# Patient Record
Sex: Male | Born: 1985 | Race: White | Hispanic: No | Marital: Single | Smoking: Never smoker
Health system: Southern US, Community
[De-identification: ages and names within clinical notes are randomized; demographics above are authoritative.]

## PROBLEM LIST (undated history)

## (undated) DIAGNOSIS — F419 Anxiety disorder, unspecified: Secondary | ICD-10-CM

## (undated) DIAGNOSIS — K219 Gastro-esophageal reflux disease without esophagitis: Secondary | ICD-10-CM

## (undated) DIAGNOSIS — F329 Major depressive disorder, single episode, unspecified: Secondary | ICD-10-CM

## (undated) DIAGNOSIS — F32A Depression, unspecified: Secondary | ICD-10-CM

## (undated) HISTORY — DX: Depression, unspecified: F32.A

## (undated) HISTORY — DX: Major depressive disorder, single episode, unspecified: F32.9

## (undated) HISTORY — PX: OTHER SURGICAL HISTORY: SHX169

## (undated) HISTORY — DX: Anxiety disorder, unspecified: F41.9

## (undated) HISTORY — DX: Gastro-esophageal reflux disease without esophagitis: K21.9

---

## 2017-11-27 ENCOUNTER — Ambulatory Visit (HOSPITAL_COMMUNITY): Payer: BLUE CROSS/BLUE SHIELD | Admitting: Psychiatry

## 2017-11-27 ENCOUNTER — Encounter (INDEPENDENT_AMBULATORY_CARE_PROVIDER_SITE_OTHER): Payer: Self-pay

## 2017-11-27 ENCOUNTER — Encounter (HOSPITAL_COMMUNITY): Payer: Self-pay | Admitting: Psychiatry

## 2017-11-27 VITALS — BP 122/70 | HR 74 | Ht 71.5 in | Wt 223.6 lb

## 2017-11-27 DIAGNOSIS — R4581 Low self-esteem: Secondary | ICD-10-CM

## 2017-11-27 DIAGNOSIS — F840 Autistic disorder: Secondary | ICD-10-CM | POA: Diagnosis not present

## 2017-11-27 DIAGNOSIS — F411 Generalized anxiety disorder: Secondary | ICD-10-CM | POA: Diagnosis not present

## 2017-11-27 DIAGNOSIS — Z818 Family history of other mental and behavioral disorders: Secondary | ICD-10-CM

## 2017-11-27 DIAGNOSIS — Z79899 Other long term (current) drug therapy: Secondary | ICD-10-CM

## 2017-11-27 DIAGNOSIS — F331 Major depressive disorder, recurrent, moderate: Secondary | ICD-10-CM | POA: Diagnosis not present

## 2017-11-27 MED ORDER — VORTIOXETINE HBR 5 MG PO TABS: 5.0000 mg | ORAL_TABLET | ORAL | 3 refills | Status: DC

## 2017-11-27 NOTE — Progress Notes (Signed)
Psychiatric Initial Adult Assessment   Patient Identification: Donald Barr MRN:  [id number] Date of Evaluation:  11/28/2017 Referral Source: self  Chief Complaint:  anxiety Visit Diagnosis:    ICD-10-CM   1. Autism spectrum F84.0 Neuropsychological testing    vortioxetine HBr (TRINTELLIX) 5 MG TABS    Ambulatory referral to Psychology  2. Generalized anxiety disorder F41.1 vortioxetine HBr (TRINTELLIX) 5 MG TABS    Ambulatory referral to Psychology  3. Moderate episode of recurrent major depressive disorder (HCC) F33.1 vortioxetine HBr (TRINTELLIX) 5 MG TABS    Ambulatory referral to Psychology    History of Present Illness:  Donald Barr is a 31 year old male with a psychiatric history of anxiety, social anxiety, OCD, suspected autism spectrum disorder since childhood.  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  He lives with a roommate with whom he gets along well.  He identifies as gay, is not currently in any sexual relationship and does not have any partner.  He reports that he was previously on Prozac when he was a teenager which helped with his anxiety and obsessive tendencies.  He wonders about restarting an SSRI to help with his social anxiety, and excessive worry.  He reports that he tends to sleep well at night.  He denies any suicidal thoughts or self-harm thoughts.  He does not engage in any substance abuse.  His anxiety and mood symptoms include excessive worry, periods of dysphoria and depression, decreased energy, difficulty with concentration.  He felt that Prozac was helpful but it made him feel a bit flat in terms of his affect.  He also dislikes some of the sexual side effects.  He does not have any history of mania or psychosis.  He has a family history of anxiety.  We agreed to start Trintellix 5 mg daily and increase to 10 mg daily as tolerated.  He describes that he is fairly  sensitive to medicines, I reviewed the risks of gastrointestinal distress and nausea, and encouraged him to reach out if he has any side effects.  With regard to autism, he has not been formally assessed or diagnosed, but he does present with the social impairments, repetitive and obsessive behaviors, and does endorse difficulties with speech and language as a child.  He is now as an adult quite obsessed with language.  He was able to guess the origin of this writer's last name as being ChadPersian.  He speaks 3 different languages, and is quite fascinated with various dialect and cultures.  He would like to work with a therapist on being able to have more comfort and socialization and eye contact.  He reports that eye contact has always been uncomfortable for him, so he has trained himself to look at people's foreheads..  Past Psychiatric History: none  Previous Psychotropic Medications: Yes   Substance Abuse History in the last 12 months:  No.  Consequences of Substance Abuse: Negative  Past Medical History:  Past Medical History:  Diagnosis Date  . Anxiety   . Depression   . GERD (gastroesophageal reflux disease)     Past Surgical History:  Procedure Laterality Date  . right ankle surgery      Family Psychiatric History: History of anxiety  Family History: History reviewed. No pertinent family history.  Social History:   Social History   Socioeconomic History  . Marital status: Single    Spouse name: None  . Number  of children: None  . Years of education: None  . Highest education level: None  Social Needs  . Financial resource strain: Somewhat hard  . Food insecurity - worry: Never true  . Food insecurity - inability: Never true  . Transportation needs - medical: Yes  . Transportation needs - non-medical: Yes  Occupational History  . None  Tobacco Use  . Smoking status: Never Smoker  . Smokeless tobacco: Never Used  Substance and Sexual Activity  . Alcohol use: No     Frequency: Never  . Drug use: No  . Sexual activity: No  Other Topics Concern  . None  Social History Narrative  . None    Additional Social History: Lives with a roommate in [place], has a masters degree in [place] literature  Allergies:   Allergies  Allergen Reactions  . Clonazepam     Patient had suicidal thoughts     Metabolic Disorder Labs: No results found for: HGBA1C, MPG No results found for: PROLACTIN No results found for: CHOL, TRIG, HDL, CHOLHDL, VLDL, LDLCALC   Current Medications: Current Outpatient Medications  Medication Sig Dispense Refill  . vortioxetine HBr (TRINTELLIX) 5 MG TABS Take 1 tablet (5 mg total) by mouth every morning. 30 tablet 3   No current facility-administered medications for this visit.     Neurologic: Headache: Negative Seizure: Negative Paresthesias:Negative  Musculoskeletal: Strength & Muscle Tone: within normal limits Gait & Station: normal Patient leans: N/A  Psychiatric Specialty Exam: Review of Systems  Constitutional: Negative.   HENT: Negative.   Eyes: Negative.   Respiratory: Negative.   Cardiovascular: Negative.   Gastrointestinal: Negative.   Musculoskeletal: Negative.   Neurological: Negative.   Psychiatric/Behavioral: Positive for depression. The patient is nervous/anxious.     Blood pressure 122/70, pulse 74, height 5' 11.5" (1.816 m), weight 223 lb 9.6 oz (101.4 kg).Body mass index is 30.75 kg/m.  General Appearance: Casual and Well Groomed  Eye Contact:  Poor  Speech:  Clear and Coherent  Volume:  loud, robotic, monotone  Mood:  Euthymic  Affect:  limited range  Thought Process:  Goal Directed and Descriptions of Associations: Intact  Orientation:  Full (Time, Place, and Person)  Thought Content:  Computation  Suicidal Thoughts:  No  Homicidal Thoughts:  No  Memory:  Immediate;   Fair  Judgement:  Good  Insight:  Fair  Psychomotor Activity:  Normal  Concentration:  Attention Span: Fair   Recall:  [employer] of Knowledge:Good  Language: Good  Akathisia:  Negative  Handed:  Right  AIMS (if indicated):  n/a  Assets:  Communication Skills Desire for Improvement Financial Resources/Insurance Housing Transportation Vocational/Educational  ADL's:  Intact  Cognition: WNL  Sleep:  6-8 hours    Treatment Plan Summary: Donald Barr is a 31 year old male with a presumed diagnosis of autism spectrum disorder, high functioning, in conjunction with generalized anxiety, social anxiety, and features of obsessive-compulsive disorder.  He presents with the characteristic history of developmental delay, ongoing deficits in interpersonal socialization, repetitive and obsessive behaviors.  He is predominantly distressed by his periods of social anxiety, which contribute to dysphoria and poor self-esteem.  He very much desires improving his ability to socialize and connect with others.  I believe he would benefit from low-dose SSRI and social skills training and he is agreeable to both interventions.  He had previously been on Prozac as a older teenager and reports benefits except for some sexual side effects and feeling that his affect was  even more flattened, but his social anxiety was better.  We agreed to start vortioxetine given the reduced side effect profile and improvements in cognitive processing.  1. Autism spectrum   2. Generalized anxiety disorder   3. Moderate episode of recurrent major depressive disorder (HCC)     Status of current problems: new  Labs Ordered: Orders Placed This Encounter  Procedures  . Ambulatory referral to Psychology    Referral Priority:   Routine    Referral Type:   Psychiatric    Referral Reason:   Specialty Services Required    Requested Specialty:   Psychology    Number of Visits Requested:   1  . Neuropsychological testing    Standing Status:   Future    Standing Expiration Date:   11/27/2018    Scheduling Instructions:     ADHD and  autism testing, please complete formal IQ testing as well.    Order Specific Question:   Where should this test [facility]    [facility]    Labs Reviewed: n/a  Collateral Obtained/Records Reviewed: [facility] - prescribed clonazepam by Obie DredgeBrittany Davis, family medicine PA, in March 2017   Plan:  Trintellix 5 mg daily RTC 3 months Individual therapy referral  I spent 40 minutes with the patient in direct face-to-face clinical care.  Greater than 50% of this time was spent in counseling and coordination of care with the patient.   Burnard LeighAlexander Arya Casilda Pickerill, MD 12/5/20189:03 AM

## 2018-02-14 ENCOUNTER — Ambulatory Visit (HOSPITAL_COMMUNITY): Payer: BLUE CROSS/BLUE SHIELD | Admitting: Psychiatry

## 2018-12-19 ENCOUNTER — Emergency Department (HOSPITAL_COMMUNITY): Payer: BLUE CROSS/BLUE SHIELD

## 2018-12-19 ENCOUNTER — Encounter (HOSPITAL_COMMUNITY): Payer: Self-pay | Admitting: Emergency Medicine

## 2018-12-19 ENCOUNTER — Other Ambulatory Visit: Payer: Self-pay

## 2018-12-19 ENCOUNTER — Emergency Department (HOSPITAL_COMMUNITY)
Admission: EM | Admit: 2018-12-19 | Discharge: 2018-12-19 | Disposition: A | Discharge status: Home / Self Care | Payer: BLUE CROSS/BLUE SHIELD | Attending: Emergency Medicine | Admitting: Emergency Medicine

## 2018-12-19 DIAGNOSIS — R1011 Right upper quadrant pain: Secondary | ICD-10-CM

## 2018-12-19 LAB — COMPREHENSIVE METABOLIC PANEL
ALBUMIN: 4 g/dL (ref 3.5–5.0)
ALT: 22 U/L (ref 0–44)
ANION GAP: 10 (ref 5–15)
AST: 18 U/L (ref 15–41)
Alkaline Phosphatase: 84 U/L (ref 38–126)
BUN: 18 mg/dL (ref 6–20)
CHLORIDE: 104 mmol/L (ref 98–111)
CO2: 28 mmol/L (ref 22–32)
Calcium: 9.1 mg/dL (ref 8.9–10.3)
Creatinine, Ser: 0.92 mg/dL (ref 0.61–1.24)
GFR calc Af Amer: >60 mL/min (ref >60)
GFR calc non Af Amer: >60 mL/min (ref >60)
GLUCOSE: 110 mg/dL — AB (ref 70–99)
POTASSIUM: 4.4 mmol/L (ref 3.5–5.1)
Sodium: 142 mmol/L (ref 135–145)
TOTAL PROTEIN: 7.3 g/dL (ref 6.5–8.1)
Total Bilirubin: 0.6 mg/dL (ref 0.3–1.2)

## 2018-12-19 LAB — LIPASE, BLOOD: Lipase: 28 U/L (ref 11–51)

## 2018-12-19 LAB — CBC WITH DIFFERENTIAL/PLATELET
ABS IMMATURE GRANULOCYTES: 0.03 10*3/uL (ref 0.00–0.07)
BASOS ABS: 0 10*3/uL (ref 0.0–0.1)
Basophils Relative: 1 %
EOS ABS: 0 10*3/uL (ref 0.0–0.5)
Eosinophils Relative: 1 %
HEMATOCRIT: 46.9 % (ref 39.0–52.0)
Hemoglobin: 15.1 g/dL (ref 13.0–17.0)
Immature Granulocytes: 0 %
LYMPHS ABS: 1.8 10*3/uL (ref 0.7–4.0)
LYMPHS PCT: 22 %
MCH: 29.9 pg (ref 26.0–34.0)
MCHC: 32.2 g/dL (ref 30.0–36.0)
MCV: 92.9 fL (ref 80.0–100.0)
MONOS PCT: 9 %
Monocytes Absolute: 0.7 10*3/uL (ref 0.1–1.0)
NEUTROS ABS: 5.4 10*3/uL (ref 1.7–7.7)
NEUTROS PCT: 67 %
NRBC: 0 % (ref 0.0–0.2)
PLATELETS: 237 10*3/uL (ref 150–400)
RBC: 5.05 MIL/uL (ref 4.22–5.81)
RDW: 12.5 % (ref 11.5–15.5)
WBC: 7.9 10*3/uL (ref 4.0–10.5)

## 2018-12-19 LAB — URINALYSIS, ROUTINE W REFLEX MICROSCOPIC
BACTERIA UA: NONE SEEN
BILIRUBIN URINE: NEGATIVE
Glucose, UA: NEGATIVE mg/dL
Ketones, ur: NEGATIVE mg/dL
Leukocytes, UA: NEGATIVE
Nitrite: NEGATIVE
Protein, ur: NEGATIVE mg/dL
SPECIFIC GRAVITY, URINE: 1.025 (ref 1.005–1.030)
pH: 5 (ref 5.0–8.0)

## 2018-12-19 MED ORDER — ONDANSETRON 8 MG PO TBDP: 8.0000 mg | ORAL_TABLET | Freq: Once | ORAL | Status: DC

## 2018-12-19 NOTE — Discharge Instructions (Addendum)
Please call the surgeon to arrange an office evaluation.  In the meantime, stay on a low-fat diet.  Return to the emergency department if you have recurrence of pain, and are not able to manage it at home.

## 2018-12-19 NOTE — ED Provider Notes (Signed)
[facility]-EMERGENCY DEPT Provider Note   CSN: [id number] Arrival date & time: 12/19/18  0146     History   Chief Complaint Chief Complaint  Patient presents with  . Abdominal Pain    RLQ    HPI Donald Barr is a 32 y.o. male.  The history is provided by the patient.  Abdominal Pain    He has history of GERD and depression and anxiety, and comes in with right upper quadrant abdominal pain which started about 1.5 hours ago.  Pain started after eating some fried rice.  There has been some mild nausea but no vomiting.  He rates his pain at 6/10.  It comes and goes.  Sometimes it is worse if he presses on the area, sometimes not.  He has never had pain like this before.  He denies fever or chills.  He has not vomited and there has been no diarrhea.  Past Medical History:  Diagnosis Date  . Anxiety   . Depression   . GERD (gastroesophageal reflux disease)     There are no active problems to display for this patient.   Past Surgical History:  Procedure Laterality Date  . right ankle surgery          Home Medications    Prior to Admission medications   Medication Sig Start Date End Date Taking? Authorizing Provider  vortioxetine HBr (TRINTELLIX) 5 MG TABS Take 1 tablet (5 mg total) by mouth every morning. 11/27/17   Eksir, Bo McclintockAlexander Arya, MD    Family History History reviewed. No pertinent family history.  Social History Social History   Tobacco Use  . Smoking status: Never Smoker  . Smokeless tobacco: Never Used  Substance Use Topics  . Alcohol use: No    Frequency: Never  . Drug use: No     Allergies   Clonazepam   Review of Systems Review of Systems  Gastrointestinal: Positive for abdominal pain.  All other systems reviewed and are negative.    Physical Exam Updated Vital Signs BP 132/77 (BP Location: Left Arm)   Pulse 72   Temp 98.4 F (36.9 C) (Oral)   Resp 18   SpO2 98%   Physical Exam Vitals signs and  nursing note reviewed.    [age] year old male, resting comfortably and in no acute distress. Vital signs are normal. Oxygen saturation is 98%, which is normal. Head is normocephalic and atraumatic. PERRLA, EOMI. Oropharynx is clear. Neck is nontender and supple without adenopathy or JVD. Back is nontender and there is no CVA tenderness. Lungs are clear without rales, wheezes, or rhonchi. Chest is nontender. Heart has regular rate and rhythm without murmur. Abdomen is soft, flat, with moderate right upper quadrant tenderness.  There is plus/minus Murphy sign.  There are no masses or hepatosplenomegaly and peristalsis is normoactive. Extremities have no cyanosis or edema, full range of motion is present. Skin is warm and dry without rash. Neurologic: Mental status is normal, cranial nerves are intact, there are no motor or sensory deficits.  ED Treatments / Results  Labs (all labs ordered are listed, but only abnormal results are displayed) Labs Reviewed  COMPREHENSIVE METABOLIC PANEL - Abnormal; Notable for the following components:      Result Value   Glucose, Bld 110 (*)    All other components within normal limits  URINALYSIS, ROUTINE W REFLEX MICROSCOPIC - Abnormal; Notable for the following components:   Hgb urine dipstick SMALL (*)    All other  components within normal limits  LIPASE, BLOOD  CBC WITH DIFFERENTIAL/PLATELET   Radiology [place] Abdomen Complete  Result Date: 12/19/2018 CLINICAL DATA:  Acute onset of right upper quadrant abdominal pain. EXAM: ABDOMEN ULTRASOUND COMPLETE COMPARISON:  None. FINDINGS: Gallbladder: There is question of a 1.1 cm stone at the fundus of the gallbladder, though this is difficult to fully characterize. Apparent gallbladder wall thickening is thought to reflect relative decompression. No ultrasonographic Murphy's sign is elicited. No pericholecystic fluid is seen. Common bile duct: Diameter: 0.2 cm, within normal limits in caliber. Liver: No focal  lesion identified. Within normal limits in parenchymal echogenicity. Portal vein is patent on color Doppler imaging with normal direction of blood flow towards the liver. IVC: No abnormality visualized. Pancreas: Visualized portion unremarkable. Spleen: Size and appearance within normal limits. Right Kidney: Length: 9.2 cm. Echogenicity within normal limits. No mass or hydronephrosis visualized. Left Kidney: Length: 12.1 cm. Echogenicity within normal limits. No mass or hydronephrosis visualized. Abdominal aorta: No aneurysm visualized. Other findings: None. IMPRESSION: 1. No acute abnormality seen in the abdomen. 2. Question of cholelithiasis. Gallbladder otherwise unremarkable, though difficult to fully assess due to the patient's habitus and adjacent bowel. Electronically Signed   By: Roanna RaiderJeffery  Chang M.D.   On: 12/19/2018 03:58    Procedures Procedures   Medications Ordered in ED Medications - No data to display   Initial Impression / Assessment and Plan / ED Course  I have reviewed the triage vital signs and the nursing notes.  Pertinent labs & imaging results that were available during my care of the patient were reviewed by me and considered in my medical decision making (see chart for details).  Right upper quadrant pain concerning for biliary colic.  Abdominal ultrasound has been ordered.  Screening labs are obtained.  Patient is requesting no medication for pain or nausea.  Old records are reviewed, and he has no relevant past visits.  Labs are reassuring.  No elevation of lipase, transaminases, alkaline phosphatase, or bilirubin.  WBC is normal.  Ultrasound shows probable 1.1cm gallbladder calculus, but incompletely characterized due to surrounding bowel gas.  Given his symptoms and physical findings, I strongly suspect that this is the cause of his pain, and [facility] is [facility].  This has been explained to the patient.  Return precautions  discussed.  Final Clinical Impressions(s) / ED Diagnoses   Final diagnoses:  RUQ pain    ED Discharge Orders    None       Dione BoozeGlick, Titianna Loomis, MD 12/19/18 [phone]

## 2018-12-19 NOTE — ED Triage Notes (Signed)
Pt reports intermitant abd pain feels like be stabb in side by sharp object.

## 2019-02-06 ENCOUNTER — Ambulatory Visit (HOSPITAL_COMMUNITY)
Admission: EM | Admit: 2019-02-06 | Discharge: 2019-02-06 | Disposition: A | Discharge status: Home / Self Care | Payer: BLUE CROSS/BLUE SHIELD | Attending: Family Medicine | Admitting: Family Medicine

## 2019-02-06 ENCOUNTER — Encounter (HOSPITAL_COMMUNITY): Payer: Self-pay | Admitting: Emergency Medicine

## 2019-02-06 DIAGNOSIS — M25512 Pain in left shoulder: Secondary | ICD-10-CM

## 2019-02-06 DIAGNOSIS — G8929 Other chronic pain: Secondary | ICD-10-CM | POA: Diagnosis not present

## 2019-02-06 DIAGNOSIS — F419 Anxiety disorder, unspecified: Secondary | ICD-10-CM | POA: Diagnosis not present

## 2019-02-06 MED ORDER — CYCLOBENZAPRINE HCL 5 MG PO TABS: 5.0000 mg | ORAL_TABLET | Freq: Every day | ORAL | 0 refills | Status: AC

## 2019-02-06 MED ORDER — RANITIDINE HCL 150 MG PO CAPS: 150.0000 mg | ORAL_CAPSULE | Freq: Every day | ORAL | 0 refills | Status: AC

## 2019-02-06 NOTE — ED Provider Notes (Signed)
[facility]    CSN: [id number] Arrival date & time: 02/06/19  1819     History   Chief Complaint Chief Complaint  Patient presents with  . Anxiety  . Epigastric Pain    HPI Donald Barr is a 33 y.o. male.   Pt states he has issues with anxiety, states hes taken previous anxiety meds (prozac) but didn't like the side affects so hes been off for a year. Pt states he also has indigestion. Pt c/o anxiety x5 days. And chest pain as well.      Past Medical History:  Diagnosis Date  . Anxiety   . Depression   . GERD (gastroesophageal reflux disease)     There are no active problems to display for this patient.   Past Surgical History:  Procedure Laterality Date  . right ankle surgery         Home Medications    Prior to Admission medications   Medication Sig Start Date End Date Taking? Authorizing Provider  cyclobenzaprine (FLEXERIL) 5 MG tablet Take 1 tablet (5 mg total) by mouth at bedtime. 02/06/19   Elvina SidleLauenstein, Denorris Reust, MD  omeprazole (PRILOSEC OTC) 20 MG tablet Take 20 mg by mouth daily.    [provider]  ranitidine (ZANTAC) 150 MG capsule Take 1 capsule (150 mg total) by mouth daily. 02/06/19   Elvina SidleLauenstein, Amed Datta, MD    Family History No family history on file.  Social History Social History   Tobacco Use  . Smoking status: Never Smoker  . Smokeless tobacco: Never Used  Substance Use Topics  . Alcohol use: No    Frequency: Never  . Drug use: No     Allergies   Clonazepam   Review of Systems Review of Systems   Physical Exam Triage Vital Signs ED Triage Vitals  Enc Vitals Group     BP 02/06/19 1850 111/68     Pulse Rate 02/06/19 1850 100     Resp 02/06/19 1850 20     Temp 02/06/19 1850 98 F (36.7 C)     Temp src --      SpO2 02/06/19 1850 100 %     Weight --      Height --      Head Circumference --      Peak Flow --      Pain Score 02/06/19 1853 0     Pain Loc --      Pain Edu? --      Excl. in GC? --      No data found.  Updated Vital Signs BP 111/68   Pulse 100   Temp 98 F (36.7 C)   Resp 20   SpO2 100%   Physical Exam Vitals signs and nursing note reviewed.  Constitutional:      Appearance: Normal appearance. He is obese.  HENT:     Right Ear: Tympanic membrane and external ear normal.     Left Ear: Tympanic membrane and external ear normal.     Mouth/Throat:     Pharynx: Oropharynx is clear.  Eyes:     Conjunctiva/sclera: Conjunctivae normal.  Neck:     Musculoskeletal: Normal range of motion and neck supple.  Cardiovascular:     Rate and Rhythm: Normal rate.     Heart sounds: Normal heart sounds.  Pulmonary:     Effort: Pulmonary effort is normal.     Breath sounds: Normal breath sounds.  Abdominal:     General: Bowel sounds  are normal.     Tenderness: There is no abdominal tenderness.  Musculoskeletal: Normal range of motion.  Skin:    General: Skin is warm.  Neurological:     General: No focal deficit present.     Mental Status: He is alert.  Psychiatric:        Mood and Affect: Mood normal.     Comments: Long discussion of anxiety and limits of medication.  We offered a variety of strategies:  Music, yoga, stretching, building relationships, avoiding social media, hot shower, walks outside      UC Treatments / Results  Labs (all labs ordered are listed, but only abnormal results are displayed) Labs Reviewed - No data to display  EKG Normal sinus rhythm on 12-lead EKG done tonight  Radiology No results found.  Procedures Procedures (including critical care time)  Medications Ordered in UC Medications - No data to display  Initial Impression / Assessment and Plan / UC Course  I have reviewed the triage vital signs and the nursing notes.  Pertinent labs & imaging results that were available during my care of the patient were reviewed by me and considered in my medical decision making (see chart for details).     Final Clinical  Impressions(s) / UC Diagnoses   Final diagnoses:  Chronic left shoulder pain  Anxiety     Discharge Instructions     Yoga, stretches, quiet music, relationships, hot shower    ED Prescriptions    Medication Sig Dispense Auth. Provider   ranitidine (ZANTAC) 150 MG capsule Take 1 capsule (150 mg total) by mouth daily. 30 capsule Elvina SidleLauenstein, Jayde Daffin, MD   cyclobenzaprine (FLEXERIL) 5 MG tablet Take 1 tablet (5 mg total) by mouth at bedtime. 7 tablet Elvina SidleLauenstein, Garielle Mroz, MD     Controlled Substance Prescriptions [place] Controlled Substance Registry consulted? Not Applicable   Elvina SidleLauenstein, Hava Massingale, MD 02/07/19 1121

## 2019-02-06 NOTE — ED Triage Notes (Signed)
Pt states he has issues with anxiety, states hes taken previous anxiety meds (prozac) but didn't like the side affects so hes been off for a year. Pt states he also has indigestion. Pt c/o anxiety x5 days. And chest pain as well.

## 2019-02-06 NOTE — Discharge Instructions (Addendum)
Yoga, stretches, quiet music, relationships, hot shower

## 2019-04-15 ENCOUNTER — Other Ambulatory Visit: Payer: Self-pay

## 2019-04-15 ENCOUNTER — Ambulatory Visit (HOSPITAL_COMMUNITY)
Admission: EM | Admit: 2019-04-15 | Discharge: 2019-04-15 | Disposition: A | Discharge status: Home / Self Care | Payer: PRIVATE HEALTH INSURANCE | Attending: Family Medicine | Admitting: Family Medicine

## 2019-04-15 ENCOUNTER — Encounter (HOSPITAL_COMMUNITY): Payer: Self-pay

## 2019-04-15 DIAGNOSIS — M25512 Pain in left shoulder: Secondary | ICD-10-CM

## 2019-04-15 MED ORDER — DOCUSATE SODIUM 250 MG PO CAPS: 250.0000 mg | ORAL_CAPSULE | Freq: Every day | ORAL | 0 refills | Status: AC

## 2019-04-15 MED ORDER — METHOCARBAMOL 500 MG PO TABS: 500.0000 mg | ORAL_TABLET | Freq: Two times a day (BID) | ORAL | 0 refills | Status: AC

## 2019-04-15 NOTE — ED Triage Notes (Signed)
Suspected spider bite in left arm for past 2 days, but states arm is also cramping

## 2019-04-15 NOTE — Discharge Instructions (Signed)
You may use Tylenol for discomfort.

## 2019-04-23 NOTE — ED Provider Notes (Signed)
[facility]   [id number] 04/15/19 Arrival Time: 1843  ASSESSMENT & PLAN:  1. Acute pain of left shoulder    Meds ordered this encounter  Medications  . methocarbamol (ROBAXIN) 500 MG tablet    Sig: Take 1 tablet (500 mg total) by mouth 2 (two) times daily.    Dispense:  20 tablet    Refill:  0  . docusate sodium (COLACE) 250 MG capsule    Sig: Take 1 capsule (250 mg total) by mouth daily.    Dispense:  10 capsule    Refill:  0   Follow-up Information    [facility]   Specialty:  Urgent Care Why:  As needed. Contact information: [address] [place] [id number] [phone]         Will treat as a strain. Encouraged mobility of shoulder as he tolerates.  Reviewed expectations re: course of current medical issues. Questions answered. Outlined signs and symptoms indicating need for more acute intervention. Patient verbalized understanding. After Visit Summary given.  SUBJECTIVE: History from: patient. Donald Barr is a 33 y.o. male who reports intermittent mild to moderate pain of his left shoulder; described as cramping without radiation. "Might have had a spider bite" on his upper L arm; unsure. "Saw a red area" that has now resolved. Onset: gradual, noticed over the past two days Injury/trama: no. Symptoms have stabilized since beginning. Aggravating factors: certain movements of L shoulder. Alleviating factors: rest. Associated symptoms: none reported. Extremity sensation changes or weakness: none. Self treatment: has not tried OTCs for relief of pain. History of similar: no. Has dealt with L shoulder pain in the past. Similar.  Past Surgical History:  Procedure Laterality Date  . right ankle surgery       ROS: As per HPI. All other systems negative.   OBJECTIVE:  Vitals:   04/15/19 1907  BP: 131/89  Pulse: 98  Temp: 98.3 F (36.8 C)  SpO2: 99%    General appearance: alert;  no distress HEENT: Donald Barr; AT Neck: supple with FROM Extremities: . LUE: warm and well perfused; poorly localized mild tenderness over left shoulder area; without gross deformities; with no swelling; with no bruising; ROM: normal with reported mild anterior discomfort CV: brisk extremity capillary refill of LUE; 2+ radial pulse of LUE. Skin: warm and dry; no visible rashes Neurologic: gait normal; normal reflexes of RUE and LUE; normal sensation of RUE and LUE; normal strength of RUE and LUE Psychological: alert and cooperative; normal mood and affect  Allergies  Allergen Reactions  . Clonazepam     Patient had suicidal thoughts     Past Medical History:  Diagnosis Date  . Anxiety   . Depression   . GERD (gastroesophageal reflux disease)    Social History   Socioeconomic History  . Marital status: Single    Spouse name: Not on file  . Number of children: Not on file  . Years of education: Not on file  . Highest education level: Not on file  Occupational History  . Not on file  Social Needs  . Financial resource strain: Somewhat hard  . Food insecurity:    Worry: Never true    Inability: Never true  . Transportation needs:    Medical: Yes    Non-medical: Yes  Tobacco Use  . Smoking status: Never Smoker  . Smokeless tobacco: Never Used  Substance and Sexual Activity  . Alcohol use: No    Frequency:  Never  . Drug use: No  . Sexual activity: Never  Lifestyle  . Physical activity:    Days per week: 6 days    Minutes per session: 60 min  . Stress: Rather much  Relationships  . Social connections:    Talks on phone: Not on file    Gets together: Not on file    Attends religious service: Not on file    Active member of club or organization: Not on file    Attends meetings of clubs or organizations: Not on file    Relationship status: Not on file  Other Topics Concern  . Not on file  Social History Narrative  . Not on file   History reviewed. No pertinent family  history. Past Surgical History:  Procedure Laterality Date  . right ankle surgery        Mardella Layman, MD 04/23/19 [phone]

## 2020-09-01 ENCOUNTER — Other Ambulatory Visit: Payer: Self-pay

## 2020-09-01 ENCOUNTER — Encounter (HOSPITAL_COMMUNITY): Payer: Self-pay

## 2020-09-01 ENCOUNTER — Ambulatory Visit (HOSPITAL_COMMUNITY)
Admission: EM | Admit: 2020-09-01 | Discharge: 2020-09-01 | Disposition: A | Discharge status: Home / Self Care | Payer: PRIVATE HEALTH INSURANCE | Attending: Family Medicine | Admitting: Family Medicine

## 2020-09-01 DIAGNOSIS — N451 Epididymitis: Secondary | ICD-10-CM

## 2020-09-01 MED ORDER — DOXYCYCLINE HYCLATE 100 MG PO CAPS: 100.0000 mg | ORAL_CAPSULE | Freq: Two times a day (BID) | ORAL | 0 refills | Status: AC

## 2020-09-01 MED ORDER — MELOXICAM 15 MG PO TBDP: 15.0000 mg | ORAL_TABLET | Freq: Once | ORAL | 0 refills | Status: AC

## 2020-09-01 NOTE — ED Provider Notes (Signed)
[facility]    CSN: [id number] Arrival date & time: 09/01/20  1822      History   Chief Complaint Chief Complaint  Patient presents with  . Testicular Pain    HPI Donald Barr is a 34 y.o. male.   Patient has 3-day history of testicular pain.  He denies any drainage or discharge.  He denies any change in pain with change in position.  Has a past history of epididymitis.  He is concerned that he might have hernia.  He denies any swelling in the scrotum.  HPI  Past Medical History:  Diagnosis Date  . Anxiety   . Depression   . GERD (gastroesophageal reflux disease)     There are no problems to display for this patient.   Past Surgical History:  Procedure Laterality Date  . right ankle surgery         Home Medications    Prior to Admission medications   Medication Sig Start Date End Date Taking? Authorizing Provider  cyclobenzaprine (FLEXERIL) 5 MG tablet Take 1 tablet (5 mg total) by mouth at bedtime. 02/06/19   Elvina Sidle, MD  docusate sodium (COLACE) 250 MG capsule Take 1 capsule (250 mg total) by mouth daily. 04/15/19   Mardella Layman, MD  methocarbamol (ROBAXIN) 500 MG tablet Take 1 tablet (500 mg total) by mouth 2 (two) times daily. 04/15/19   Mardella Layman, MD  omeprazole (PRILOSEC OTC) 20 MG tablet Take 20 mg by mouth daily.    [provider]  ranitidine (ZANTAC) 150 MG capsule Take 1 capsule (150 mg total) by mouth daily. 02/06/19   Elvina Sidle, MD    Family History Family History  Family history unknown: Yes    Social History Social History   Tobacco Use  . Smoking status: Never Smoker  . Smokeless tobacco: Never Used  Vaping Use  . Vaping Use: Never used  Substance Use Topics  . Alcohol use: No  . Drug use: No     Allergies   Clonazepam   Review of Systems Review of Systems  Genitourinary: Positive for testicular pain. Negative for discharge, frequency, genital sores, hematuria, penile pain, penile  swelling and scrotal swelling.  All other systems reviewed and are negative.    Physical Exam Triage Vital Signs ED Triage Vitals  Enc Vitals Group     BP 09/01/20 1951 (!) 141/86     Pulse Rate 09/01/20 1951 78     Resp --      Temp 09/01/20 1951 97.8 F (36.6 C)     Temp Source 09/01/20 1951 Oral     SpO2 09/01/20 1951 98 %     Weight --      Height --      Head Circumference --      Peak Flow --      Pain Score 09/01/20 1950 7     Pain Loc --      Pain Edu? --      Excl. in GC? --    No data found.  Updated Vital Signs BP (!) 141/86 (BP Location: Left Arm)   Pulse 78   Temp 97.8 F (36.6 C) (Oral)   SpO2 98%   Visual Acuity Right Eye Distance:   Left Eye Distance:   Bilateral Distance:    Right Eye Near:   Left Eye Near:    Bilateral Near:     Physical Exam Vitals and nursing note reviewed.  Constitutional:  Appearance: Normal appearance. He is obese.  Cardiovascular:     Rate and Rhythm: Normal rate and regular rhythm.  Pulmonary:     Effort: Pulmonary effort is normal.     Breath sounds: Normal breath sounds.  Genitourinary:    Penis: Normal.      Comments: There is no detectable hernia No lumps or masses on the testicles but there is tenderness along the outside of the testis and epididymis bilaterally. Neurological:     General: No focal deficit present.     Mental Status: He is alert and oriented to person, place, and time.      UC Treatments / Results  Labs (all labs ordered are listed, but only abnormal results are displayed) Labs Reviewed - No data to display  EKG   Radiology No results found.  Procedures Procedures (including critical care time)  Medications Ordered in UC Medications - No data to display  Initial Impression / Assessment and Plan / UC Course  I have reviewed the triage vital signs and the nursing notes.  Pertinent labs & imaging results that were available during my care of the patient were reviewed by  me and considered in my medical decision making (see chart for details).     Epididymitis Final Clinical Impressions(s) / UC Diagnoses   Final diagnoses:  None   Discharge Instructions   None    ED Prescriptions    None     PDMP not reviewed this encounter.   Frederica Kuster, MD 09/01/20 2022

## 2020-09-01 NOTE — ED Triage Notes (Signed)
Pt presents with sharp testicular pain the past few days.

## 2020-11-14 IMAGING — US US ABDOMEN COMPLETE
1 series · 13 of 25 positions shown · non-contrast
Comparison: None.

CLINICAL DATA: Acute onset of right upper quadrant abdominal pain.

EXAM:
ABDOMEN ULTRASOUND COMPLETE

[Series 1: us abdomen complete · 13 of 84 slices shown]
[im 1/84]
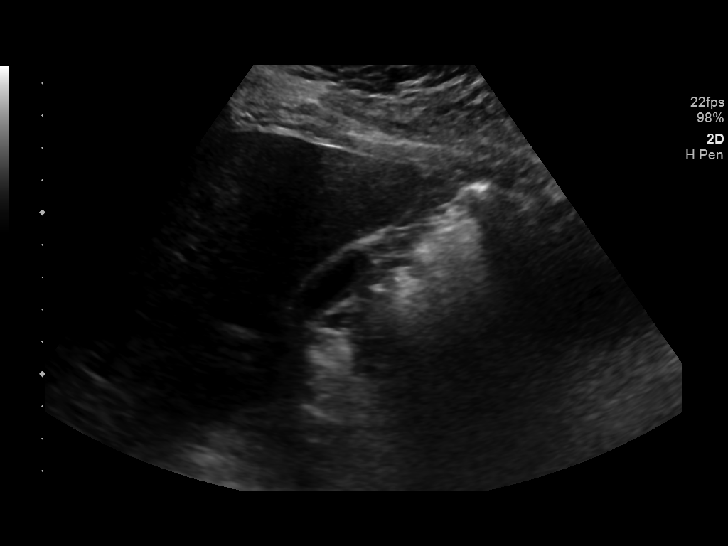
[im 7/84]
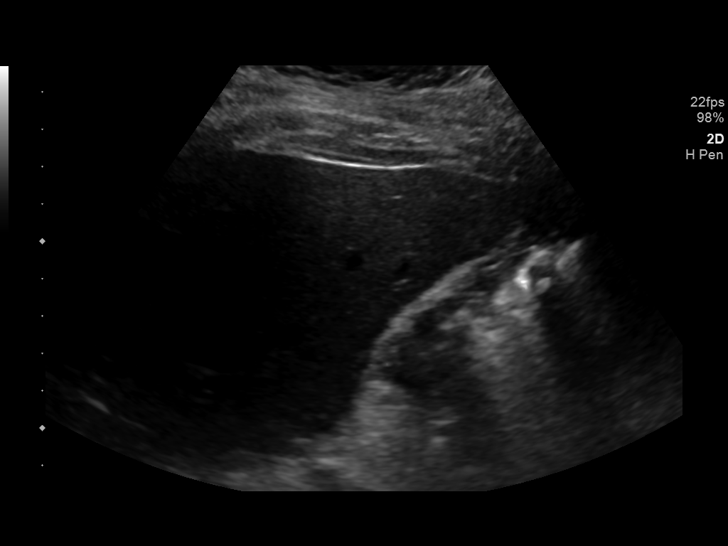
[im 14/84]
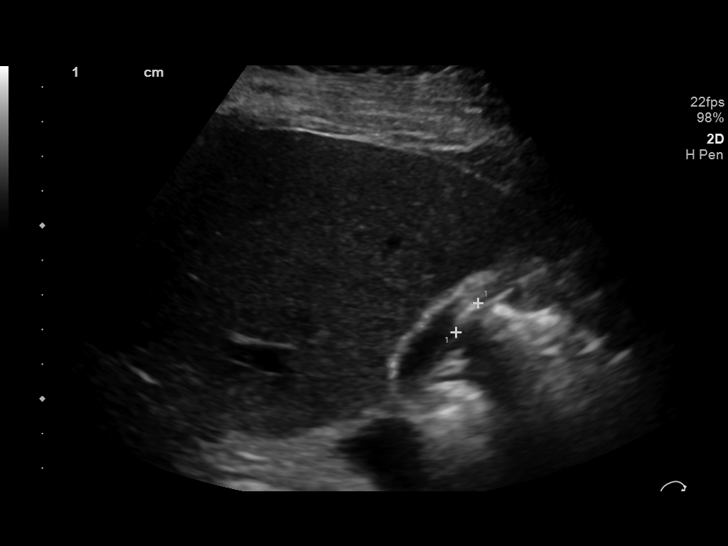
[im 21/84]
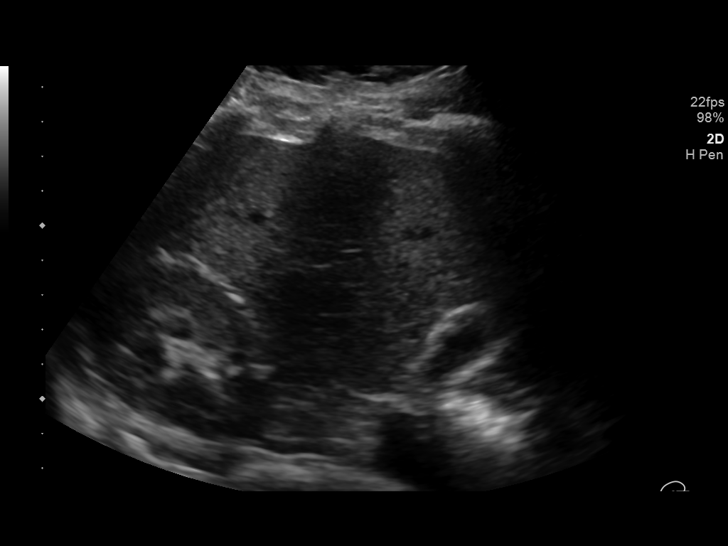
[im 28/84]
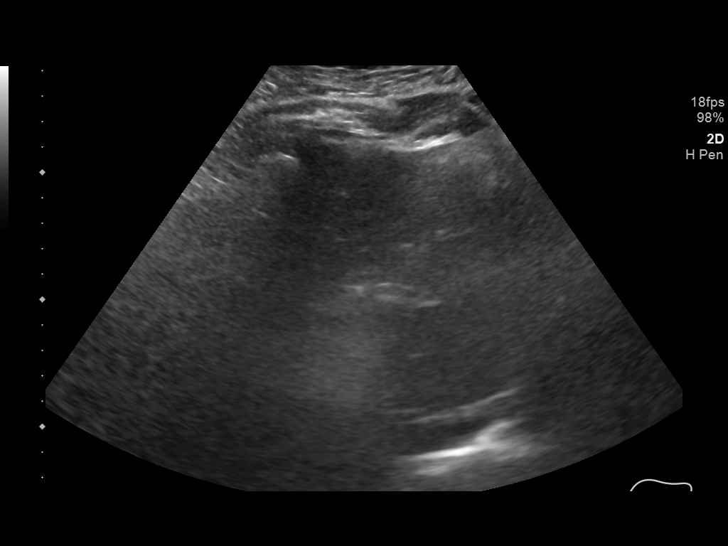
[im 35/84]
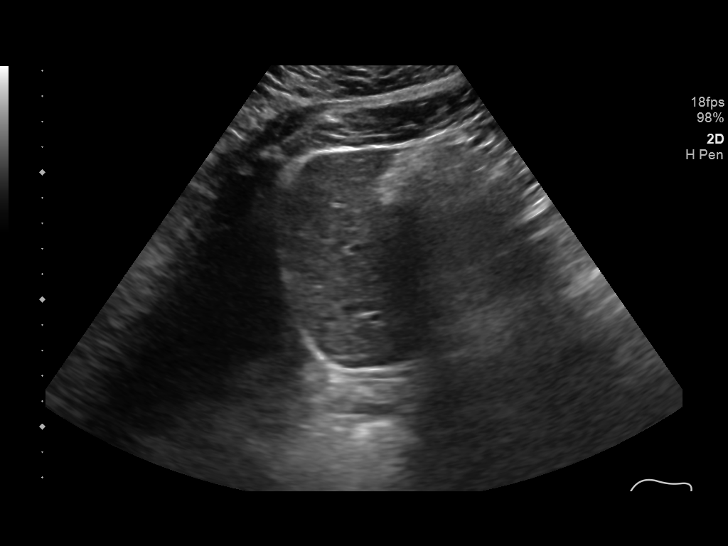
[im 42/84]
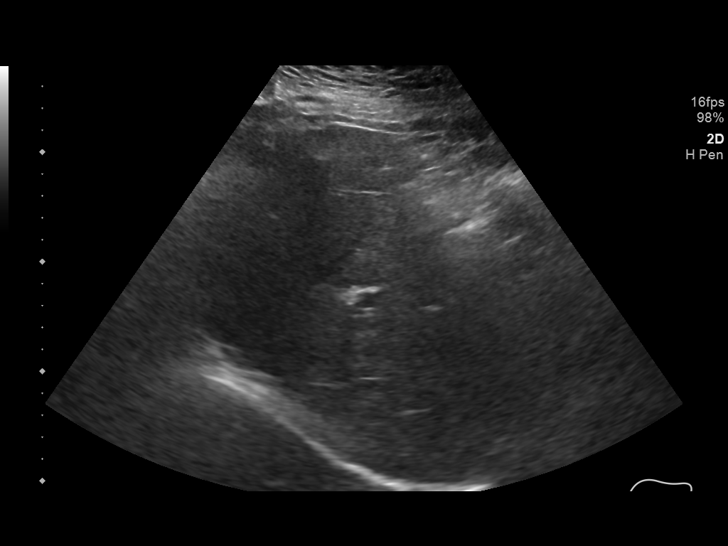
[im 49/84]
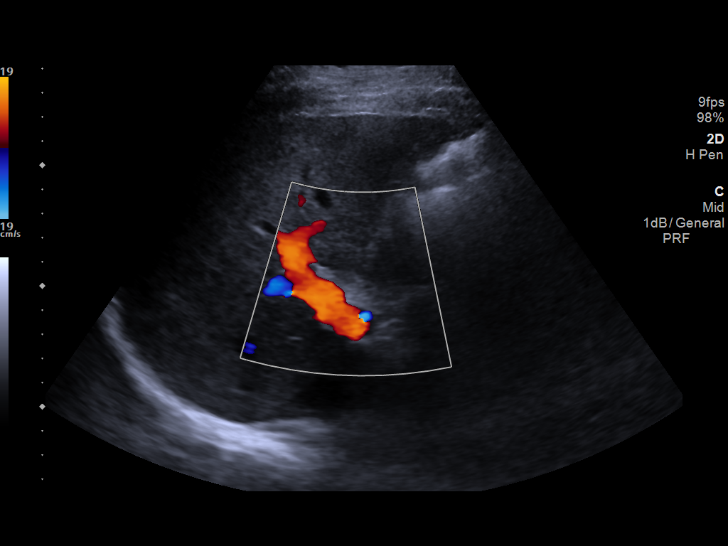
[im 56/84]
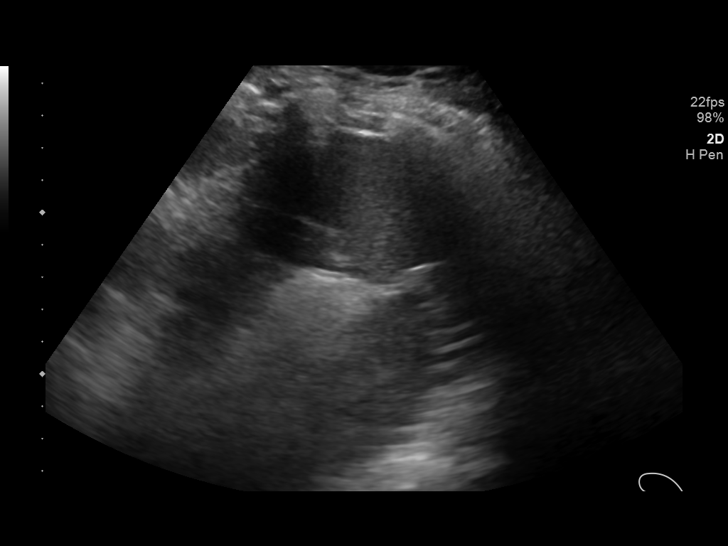
[im 63/84]
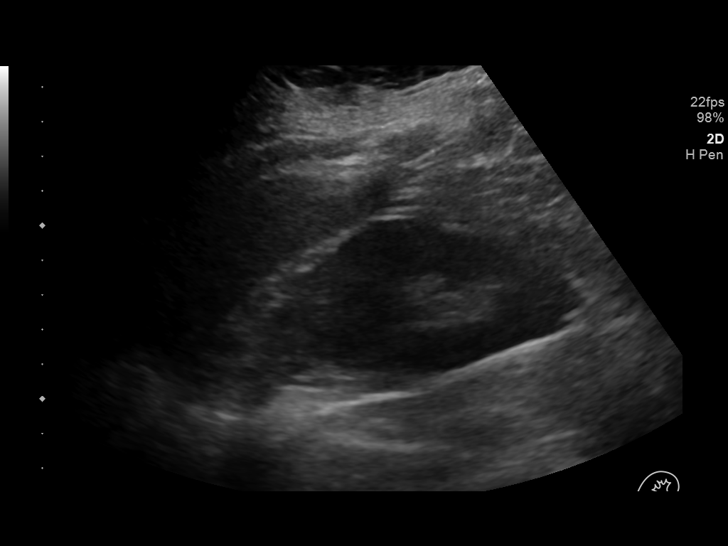
[im 70/84]
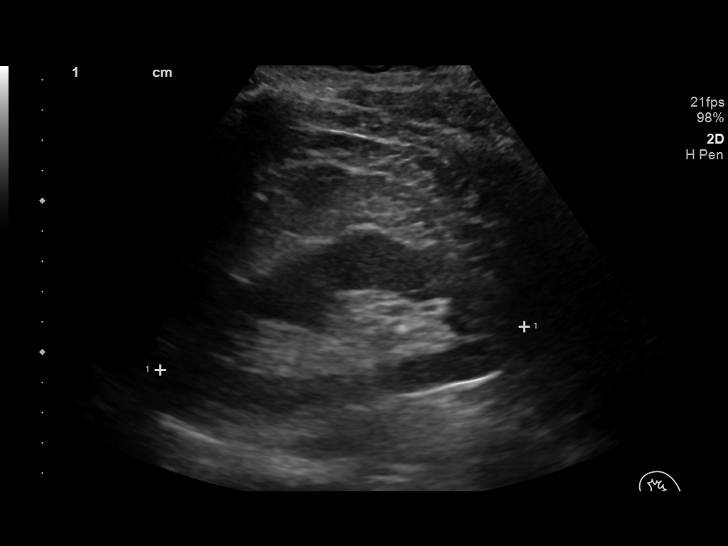
[im 77/84]
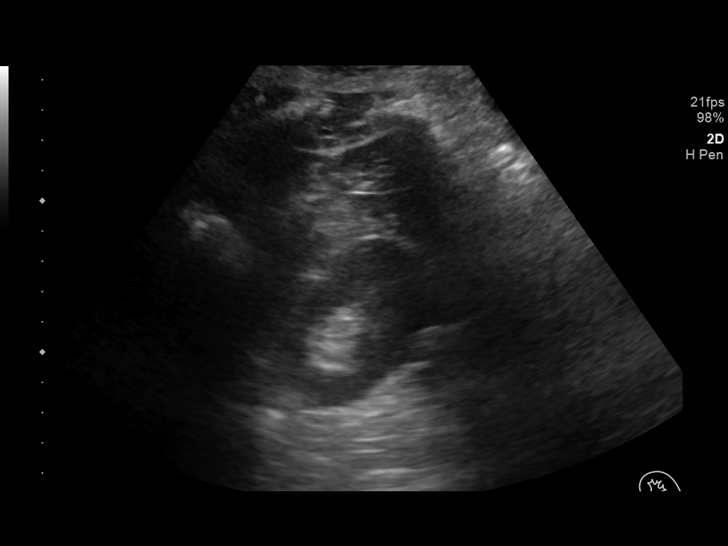
[im 84/84]
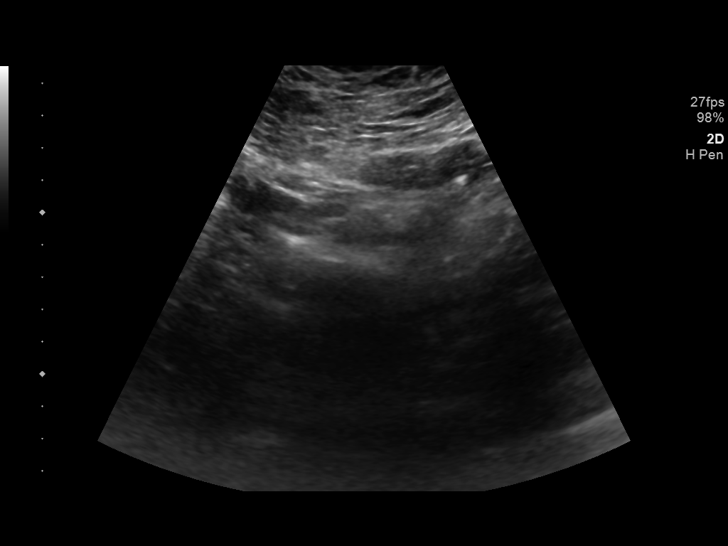

[13 of 25 positions shown; findings below may reference images not displayed]

FINDINGS: Gallbladder: There is question of a 1.1 cm stone at the fundus of
the gallbladder, though this is difficult to fully characterize.
Apparent gallbladder wall thickening is thought to reflect relative
decompression. No ultrasonographic Murphy's sign is elicited. No
pericholecystic fluid is seen.

Common bile duct: Diameter: 0.2 cm, within normal limits in caliber.

Liver: No focal lesion identified. Within normal limits in
parenchymal echogenicity. Portal vein is patent on color Doppler
imaging with normal direction of blood flow towards the liver.

IVC: No abnormality visualized.

Pancreas: Visualized portion unremarkable.

Spleen: Size and appearance within normal limits.

Right Kidney: Length: 9.2 cm. Echogenicity within normal limits. No
mass or hydronephrosis visualized.

Left Kidney: Length: 12.1 cm. Echogenicity within normal limits. No
mass or hydronephrosis visualized.

Abdominal aorta: No aneurysm visualized.

Other findings: None.
IMPRESSION: 1. No acute abnormality seen in the abdomen.
2. Question of cholelithiasis. Gallbladder otherwise unremarkable,
though difficult to fully assess due to the patient's habitus and
adjacent bowel.
